# Patient Record
Sex: Female | Born: 1995 | Race: Black or African American | Hispanic: No | Marital: Single | Smoking: Never smoker
Health system: Southern US, Community
[De-identification: ages and names within clinical notes are randomized; demographics above are authoritative.]

## PROBLEM LIST (undated history)

## (undated) HISTORY — PX: KNEE SURGERY: SHX244

---

## 2018-08-10 ENCOUNTER — Ambulatory Visit (HOSPITAL_COMMUNITY)
Admission: EM | Admit: 2018-08-10 | Discharge: 2018-08-10 | Disposition: A | Discharge status: Home / Self Care | Payer: BLUE CROSS/BLUE SHIELD | Attending: Family Medicine | Admitting: Family Medicine

## 2018-08-10 ENCOUNTER — Encounter (HOSPITAL_COMMUNITY): Payer: Self-pay

## 2018-08-10 DIAGNOSIS — J309 Allergic rhinitis, unspecified: Secondary | ICD-10-CM | POA: Diagnosis not present

## 2018-08-10 MED ORDER — FLUTICASONE PROPIONATE 50 MCG/ACT NA SUSP: 1.0000 | Freq: Every day | NASAL | 2 refills | Status: AC

## 2018-08-10 MED ORDER — CETIRIZINE-PSEUDOEPHEDRINE ER 5-120 MG PO TB12: 1.0000 | ORAL_TABLET | Freq: Every day | ORAL | 0 refills | Status: AC

## 2018-08-10 NOTE — Discharge Instructions (Signed)
I am sending some Zyrtec-D and Flonase to the pharmacy for your  symptoms Follow up as needed for continued or worsening symptoms

## 2018-08-10 NOTE — ED Provider Notes (Signed)
[facility]    CSN: [id number] Arrival date & time: 08/10/18  1659     History   Chief Complaint Chief Complaint  Patient presents with  . URI    HPI Yvonne Walton is a 23 y.o. female.   Patient is a 23 year old female who presents with 2 days of sinus congestion, rhinorrhea, sneezing, dry cough, body aches, scratchy throat.  Symptoms have been constant.  She has been using Mucinex severe cold and cough that she got last night.  Reports that this did not help her symptoms.  Positive sick contacts. No recent traveling.  No fevers.   ROS per HPI      History reviewed. No pertinent past medical history.  There are no active problems to display for this patient.   Past Surgical History:  Procedure Laterality Date  . KNEE SURGERY      OB History   No obstetric history on file.      Home Medications    Prior to Admission medications   Medication Sig Start Date End Date Taking? Authorizing Provider  cetirizine-pseudoephedrine (ZYRTEC-D) 5-120 MG tablet Take 1 tablet by mouth daily. 08/10/18   Rosser Collington, Gloris Manchester A, NP  fluticasone (FLONASE) 50 MCG/ACT nasal spray Place 1 spray into both nostrils daily. 08/10/18   Janace Aris, NP    Family History Family History  Problem Relation Age of Onset  . Healthy Mother   . Healthy Father     Social History Social History   Tobacco Use  . Smoking status: Never Smoker  . Smokeless tobacco: Never Used  Substance Use Topics  . Alcohol use: Not on file  . Drug use: Not on file     Allergies   Patient has no known allergies.   Review of Systems Review of Systems   Physical Exam Triage Vital Signs ED Triage Vitals  Enc Vitals Group     BP 08/10/18 1747 (!) 144/91     Pulse Rate 08/10/18 1747 95     Resp 08/10/18 1747 17     Temp 08/10/18 1747 98.7 F (37.1 C)     Temp Source 08/10/18 1747 Oral     SpO2 08/10/18 1747 100 %     Weight --      Height --      Head Circumference --      Peak Flow --      Pain Score 08/10/18 1814 0     Pain Loc --      Pain Edu? --      Excl. in GC? --    No data found.  Updated Vital Signs BP (!) 144/91 (BP Location: Right Arm)   Pulse 95   Temp 98.7 F (37.1 C) (Oral)   Resp 17   LMP 07/24/2018   SpO2 100%   Visual Acuity Right Eye Distance:   Left Eye Distance:   Bilateral Distance:    Right Eye Near:   Left Eye Near:    Bilateral Near:     Physical Exam Vitals signs and nursing note reviewed.  Constitutional:      General: She is not in acute distress.    Appearance: She is well-developed. She is not ill-appearing, toxic-appearing or diaphoretic.  HENT:     Head: Normocephalic and atraumatic.     Right Ear: Tympanic membrane and ear canal normal.     Left Ear: Tympanic membrane and ear canal normal.     Nose: Congestion present.  Mouth/Throat:     Pharynx: Oropharynx is clear.  Eyes:     Conjunctiva/sclera: Conjunctivae normal.  Neck:     Musculoskeletal: Normal range of motion and neck supple.  Cardiovascular:     Rate and Rhythm: Normal rate and regular rhythm.     Heart sounds: No murmur.  Pulmonary:     Effort: Pulmonary effort is normal. No respiratory distress.     Breath sounds: Normal breath sounds.  Musculoskeletal: Normal range of motion.  Lymphadenopathy:     Cervical: No cervical adenopathy.  Skin:    General: Skin is warm and dry.  Neurological:     Mental Status: She is alert.  Psychiatric:        Mood and Affect: Mood normal.      UC Treatments / Results  Labs (all labs ordered are listed, but only abnormal results are displayed) Labs Reviewed - No data to display  EKG None  Radiology No results found.  Procedures Procedures (including critical care time)  Medications Ordered in UC Medications - No data to display  Initial Impression / Assessment and Plan / UC Course  I have reviewed the triage vital signs and the nursing notes.  Pertinent labs & imaging results that were available  during my care of the patient were reviewed by me and considered in my medical decision making (see chart for details).     Symptoms consistent with allergic rhinitis Flonase and Zyrtec-D for symptoms Follow up as needed for continued or worsening symptoms  Final Clinical Impressions(s) / UC Diagnoses   Final diagnoses:  Allergic rhinitis, unspecified seasonality, unspecified trigger     Discharge Instructions     I am sending some Zyrtec-D and Flonase to the pharmacy for your  symptoms Follow up as needed for continued or worsening symptoms     ED Prescriptions    Medication Sig Dispense Auth. Provider   cetirizine-pseudoephedrine (ZYRTEC-D) 5-120 MG tablet Take 1 tablet by mouth daily. 30 tablet Marlan Steward A, NP   fluticasone (FLONASE) 50 MCG/ACT nasal spray Place 1 spray into both nostrils daily. 16 g Dahlia ByesBast, Cathaleen Korol A, NP     Controlled Substance Prescriptions [place] Controlled Substance Registry consulted? Not Applicable   Janace ArisBast, Ranvir Renovato A, NP 08/10/18 1821

## 2018-08-10 NOTE — ED Triage Notes (Signed)
Pt presents with upper respiratory symptoms; congestion, nasal drainage, chest pain from cough, body aches and sore throat X 2 days.

## 2018-08-19 ENCOUNTER — Emergency Department (HOSPITAL_COMMUNITY): Payer: BLUE CROSS/BLUE SHIELD

## 2018-08-19 ENCOUNTER — Emergency Department (HOSPITAL_COMMUNITY)
Admission: EM | Admit: 2018-08-19 | Discharge: 2018-08-19 | Disposition: A | Discharge status: Home / Self Care | Payer: BLUE CROSS/BLUE SHIELD | Attending: Emergency Medicine | Admitting: Emergency Medicine

## 2018-08-19 ENCOUNTER — Encounter (HOSPITAL_COMMUNITY): Payer: Self-pay | Admitting: Emergency Medicine

## 2018-08-19 DIAGNOSIS — K5900 Constipation, unspecified: Secondary | ICD-10-CM | POA: Diagnosis present

## 2018-08-19 DIAGNOSIS — R1084 Generalized abdominal pain: Secondary | ICD-10-CM | POA: Diagnosis not present

## 2018-08-19 LAB — CBC WITH DIFFERENTIAL/PLATELET
Abs Immature Granulocytes: 0.04 10*3/uL (ref 0.00–0.07)
Basophils Absolute: 0 10*3/uL (ref 0.0–0.1)
Basophils Relative: 0 %
Eosinophils Absolute: 0.1 10*3/uL (ref 0.0–0.5)
Eosinophils Relative: 1 %
HCT: 41.2 % (ref 36.0–46.0)
Hemoglobin: 13 g/dL (ref 12.0–15.0)
Immature Granulocytes: 0 %
Lymphocytes Relative: 23 %
Lymphs Abs: 2.6 10*3/uL (ref 0.7–4.0)
MCH: 29.8 pg (ref 26.0–34.0)
MCHC: 31.6 g/dL (ref 30.0–36.0)
MCV: 94.5 fL (ref 80.0–100.0)
Monocytes Absolute: 0.7 10*3/uL (ref 0.1–1.0)
Monocytes Relative: 6 %
Neutro Abs: 7.7 10*3/uL (ref 1.7–7.7)
Neutrophils Relative %: 70 %
Platelets: 285 10*3/uL (ref 150–400)
RBC: 4.36 MIL/uL (ref 3.87–5.11)
RDW: 13 % (ref 11.5–15.5)
WBC: 11.1 10*3/uL — ABNORMAL HIGH (ref 4.0–10.5)
nRBC: 0 % (ref 0.0–0.2)

## 2018-08-19 LAB — COMPREHENSIVE METABOLIC PANEL
ALT: 17 U/L (ref 0–44)
AST: 17 U/L (ref 15–41)
Albumin: 5.1 g/dL — ABNORMAL HIGH (ref 3.5–5.0)
Alkaline Phosphatase: 82 U/L (ref 38–126)
Anion gap: 7 (ref 5–15)
BUN: 8 mg/dL (ref 6–20)
CO2: 28 mmol/L (ref 22–32)
Calcium: 9.6 mg/dL (ref 8.9–10.3)
Chloride: 103 mmol/L (ref 98–111)
Creatinine, Ser: 0.82 mg/dL (ref 0.44–1.00)
GFR calc Af Amer: >60 mL/min (ref >60)
GFR calc non Af Amer: >60 mL/min (ref >60)
Glucose, Bld: 110 mg/dL — ABNORMAL HIGH (ref 70–99)
Potassium: 3.6 mmol/L (ref 3.5–5.1)
Sodium: 138 mmol/L (ref 135–145)
Total Bilirubin: 0.4 mg/dL (ref 0.3–1.2)
Total Protein: 9.6 g/dL — ABNORMAL HIGH (ref 6.5–8.1)

## 2018-08-19 LAB — URINALYSIS, ROUTINE W REFLEX MICROSCOPIC
Bilirubin Urine: NEGATIVE
Glucose, UA: NEGATIVE mg/dL
Hgb urine dipstick: NEGATIVE
Ketones, ur: NEGATIVE mg/dL
Leukocytes,Ua: NEGATIVE
Nitrite: NEGATIVE
Protein, ur: NEGATIVE mg/dL
Specific Gravity, Urine: 1.011 (ref 1.005–1.030)
pH: 7 (ref 5.0–8.0)

## 2018-08-19 LAB — I-STAT BETA HCG BLOOD, ED (MC, WL, AP ONLY): I-stat hCG, quantitative: <5 m[IU]/mL (ref <5)

## 2018-08-19 LAB — LIPASE, BLOOD: Lipase: 33 U/L (ref 11–51)

## 2018-08-19 MED ORDER — POLYETHYLENE GLYCOL 3350 17 GM/SCOOP PO POWD: 1.0000 | Freq: Once | ORAL | 0 refills | Status: AC

## 2018-08-19 MED ORDER — ONDANSETRON 4 MG PO TBDP: 4.0000 mg | ORAL_TABLET | Freq: Three times a day (TID) | ORAL | 0 refills | Status: AC | PRN

## 2018-08-19 MED ORDER — DOCUSATE SODIUM 100 MG PO CAPS: 100.0000 mg | ORAL_CAPSULE | Freq: Two times a day (BID) | ORAL | 0 refills | Status: AC

## 2018-08-19 MED ORDER — ONDANSETRON HCL 4 MG/2ML IJ SOLN
4.0000 mg | Freq: Once | INTRAMUSCULAR | Status: AC
  Administered 2018-08-19: 4 mg via INTRAVENOUS
  Filled 2018-08-19: qty 2

## 2018-08-19 MED ORDER — SODIUM CHLORIDE 0.9 % IV BOLUS
1000.0000 mL | Freq: Once | INTRAVENOUS | Status: AC
  Administered 2018-08-19: 1000 mL via INTRAVENOUS

## 2018-08-19 MED ORDER — KETOROLAC TROMETHAMINE 30 MG/ML IJ SOLN
30.0000 mg | Freq: Once | INTRAMUSCULAR | Status: AC
  Administered 2018-08-19: 30 mg via INTRAVENOUS
  Filled 2018-08-19: qty 1

## 2018-08-19 NOTE — ED Provider Notes (Signed)
[facility]EMERGENCY [facility]PT Provider Note   CSN: [id number] Arrival date & time: 08/19/18  1008    History   Chief Complaint Chief Complaint  Patient presents with  . abd pain  . Constipation    HPI Yvonne Walton is a 23 y.o. female with no significant past medical history presents for acute onset, progressively worsening constipation for 2 weeks and abdominal pain for 1 week.  Reports very decreased stool output for the last 2 weeks, one episode of nonbloody watery diarrhea.  For the last week or so she has had a constant dull pain to the middle of the abdomen with occasional sharp cramping pain to the midline.  Pain sometimes radiates to the flanks bilaterally. Notes nausea but not vomiting. Notes feeling bloated and passing more flatus and belching more than usual. Denies associated urinary symptoms, fever, chills, chest pain, cough.  Has not tried anything for her symptoms.  Reports that her diet mostly consists of fast food, chicken.  Reports that she drinks 2-3 water bottles daily.     The history is provided by the patient.    History reviewed. No pertinent past medical history.  There are no active problems to display for this patient.   Past Surgical History:  Procedure Laterality Date  . KNEE SURGERY       OB History   No obstetric history on file.      Home Medications    Prior to Admission medications   Medication Sig Start Date End Date Taking? Authorizing Provider  BIOTIN PO Take 1 tablet by mouth 3 (three) times a week.   Yes [provider]  fluticasone (FLONASE) 50 MCG/ACT nasal spray Place 1 spray into both nostrils daily. Patient taking differently: Place 1 spray into both nostrils daily as needed for allergies or rhinitis.  08/10/18  Yes Walton, Yvonne A, NP  ibuprofen (ADVIL,MOTRIN) 200 MG tablet Take 800 mg by mouth every 6 (six) hours as needed for moderate pain.   Yes [provider]  cetirizine-pseudoephedrine  (ZYRTEC-D) 5-120 MG tablet Take 1 tablet by mouth daily. Patient not taking: Reported on 08/19/2018 08/10/18   Dahlia ByesBast, Yvonne A, NP  docusate sodium (COLACE) 100 MG capsule Take 1 capsule (100 mg total) by mouth every 12 (twelve) hours. 08/19/18   Luevenia MaxinFawze, Yvonne Lana A, PA-C  ondansetron (ZOFRAN ODT) 4 MG disintegrating tablet Take 1 tablet (4 mg total) by mouth every 8 (eight) hours as needed. 08/19/18   Yvonne Walton A, PA-C  polyethylene glycol powder (MIRALAX) powder Take 255 g by mouth once for 1 dose. 08/19/18 08/19/18  Jeanie SewerFawze, Yvonne Resch A, PA-C    Family History Family History  Problem Relation Age of Onset  . Healthy Mother   . Healthy Father     Social History Social History   Tobacco Use  . Smoking status: Never Smoker  . Smokeless tobacco: Never Used  Substance Use Topics  . Alcohol use: Yes  . Drug use: Not on file     Allergies   Patient has no known allergies.   Review of Systems Review of Systems  Constitutional: Negative for chills and fever.  Respiratory: Negative for shortness of breath.   Cardiovascular: Negative for chest pain.  Gastrointestinal: Positive for abdominal pain, constipation and nausea. Negative for blood in stool and vomiting.  Genitourinary: Negative for dysuria, flank pain, hematuria and urgency.  All other systems reviewed and are negative.    Physical Exam Updated Vital Signs BP (!) 113/94  Pulse 77   Temp 98.4 F (36.9 C) (Oral)   Resp 16   LMP 07/24/2018   SpO2 99%   Physical Exam Vitals signs and nursing note reviewed.  Constitutional:      General: She is not in acute distress.    Appearance: She is well-developed.     Comments: Resting comfortably in bed  HENT:     Head: Normocephalic and atraumatic.  Eyes:     General:        Right eye: No discharge.        Left eye: No discharge.     Conjunctiva/sclera: Conjunctivae normal.  Neck:     Vascular: No JVD.     Trachea: No tracheal deviation.  Cardiovascular:     Rate and Rhythm:  Normal rate.  Pulmonary:     Effort: Pulmonary effort is normal.  Abdominal:     General: Abdomen is protuberant. Bowel sounds are increased. There is no distension.     Palpations: Abdomen is soft.     Tenderness: There is generalized abdominal tenderness and tenderness in the right upper quadrant, right lower quadrant, epigastric area and periumbilical area. There is no right CVA tenderness, left CVA tenderness, guarding or rebound. Negative signs include Murphy's sign, Rovsing's sign, McBurney's sign and psoas sign.  Skin:    General: Skin is warm and dry.     Findings: No erythema.  Neurological:     Mental Status: She is alert.  Psychiatric:        Behavior: Behavior normal.      ED Treatments / Results  Labs (all labs ordered are listed, but only abnormal results are displayed) Labs Reviewed  CBC WITH DIFFERENTIAL/PLATELET - Abnormal; Notable for the following components:      Result Value   WBC 11.1 (*)    All other components within normal limits  COMPREHENSIVE METABOLIC PANEL - Abnormal; Notable for the following components:   Glucose, Bld 110 (*)    Total Protein 9.6 (*)    Albumin 5.1 (*)    All other components within normal limits  URINALYSIS, ROUTINE W REFLEX MICROSCOPIC - Abnormal; Notable for the following components:   Color, Urine STRAW (*)    All other components within normal limits  LIPASE, BLOOD  I-STAT BETA HCG BLOOD, ED ([facility], [facility], [facility] ONLY)    EKG None  Radiology Dg Abdomen Acute W/chest  Result Date: 08/19/2018 CLINICAL DATA:  Abdominal pain, constipation EXAM: DG ABDOMEN ACUTE W/ 1V CHEST COMPARISON:  None FINDINGS: Normal heart size, mediastinal contours, and pulmonary vascularity. Lungs clear. No infiltrate, pleural effusion or pneumothorax. Bowel gas pattern normal. No bowel dilatation bowel wall thickening, or free air. Osseous structures unremarkable. No urinary tract calcification. IMPRESSION: Normal exam. Electronically Signed   By: Ulyses Southward M.D.   On: 08/19/2018 15:19    Procedures Procedures (including critical care time)  Medications Ordered in ED Medications  ketorolac (TORADOL) 30 MG/ML injection 30 mg (has no administration in time range)  ondansetron (ZOFRAN) injection 4 mg (4 mg Intravenous Given 08/19/18 1353)  sodium chloride 0.9 % bolus 1,000 mL (0 mLs Intravenous Stopped 08/19/18 1620)     Initial Impression / Assessment and Plan / ED Course  I have reviewed the triage vital signs and the nursing notes.  Pertinent labs & imaging results that were available during my care of the patient were reviewed by me and considered in my medical decision making (see chart for details).  Patient complaining of intermittent generalized cramping abdominal pains with constipation.  Constipation is been present for the last 2 weeks, pain for the last week.  She is afebrile, vital signs are stable.  She is nontoxic in appearance.  No peritoneal signs on examination of the abdomen.  Lab work reviewed by myself is significant for mild nonspecific leukocytosis, no anemia, no metabolic derangements.  LFTs, lipase, creatinine within normal limits.  She is not pregnant.  Acute abdomen with chest radiographs obtained which show no evidence of ileus or obstruction.  No acute cardiopulmonary abnormalities.  She was given IV fluids, Toradol, and Zofran in the ED with improvement.  Serial abdominal examinations remained benign.  No evidence of acute surgical abdominal pathology including obstruction, perforation, appendicitis, cholecystitis, or dissection.  Doubt TOA, PID, ovarian torsion, or ectopic pregnancy in the absence of GU complaints.  Pain likely secondary to constipation.  Will discharge with bowel regimen, Zofran, encouraged increased water intake and activity.  Recommend follow-up with PCP for reevaluation of symptoms.  Discussed strict ED return precautions. Pt and family verbalized understanding of and agreement with plan  and patient is safe for discharge home at this time.   Final Clinical Impressions(s) / ED Diagnoses   Final diagnoses:  Constipation, unspecified constipation type  Generalized abdominal pain    ED Discharge Orders         Ordered    polyethylene glycol powder (MIRALAX) powder   Once     08/19/18 1623    docusate sodium (COLACE) 100 MG capsule  Every 12 hours     08/19/18 1623    ondansetron (ZOFRAN ODT) 4 MG disintegrating tablet  Every 8 hours PRN     08/19/18 1623           Uriah Trueba, Chemult A, PA-C 08/19/18 1626    Shaune Pollack, MD 08/20/18 1345

## 2018-08-19 NOTE — Discharge Instructions (Signed)
1. Medications: Take 7 capfuls of MiraLAX in 32 ounces of water today.  After that, you can take 1-2 capfuls a day in 8 ounces of water.  You can also take a stool softener (colace). You can alternate 600 mg of ibuprofen and [phone] mg of Tylenol every 3 hours as needed for pain. Do not exceed 4000 mg of Tylenol daily.  Take ibuprofen with food to avoid upset stomach.  Take Zofran as needed for nausea.  Let this medicine dissolve under your tongue and wait around 10-20 minutes before eating or drinking after taking this medication. 2. Treatment: rest, drink plenty of fluids, eat a high-fiber diet.  I have attached information on this. 3. Follow Up: Please followup with your primary doctor in 3 days for discussion of your diagnoses and further evaluation after today's visit; if you do not have a primary care doctor use the resource guide provided to find one; Please return to the ER for persistent vomiting, high fevers or worsening symptoms

## 2018-08-19 NOTE — ED Triage Notes (Signed)
Pt c/o abd pains with constipation for 2 weeks.

## 2018-10-17 ENCOUNTER — Encounter (HOSPITAL_COMMUNITY): Payer: Self-pay | Admitting: *Deleted

## 2018-10-17 ENCOUNTER — Emergency Department (HOSPITAL_COMMUNITY): Payer: BLUE CROSS/BLUE SHIELD

## 2018-10-17 ENCOUNTER — Emergency Department (HOSPITAL_COMMUNITY)
Admission: EM | Admit: 2018-10-17 | Discharge: 2018-10-18 | Disposition: A | Discharge status: Home / Self Care | Payer: BLUE CROSS/BLUE SHIELD | Attending: Emergency Medicine | Admitting: Emergency Medicine

## 2018-10-17 ENCOUNTER — Other Ambulatory Visit: Payer: Self-pay

## 2018-10-17 DIAGNOSIS — R0781 Pleurodynia: Secondary | ICD-10-CM | POA: Insufficient documentation

## 2018-10-17 DIAGNOSIS — R079 Chest pain, unspecified: Secondary | ICD-10-CM | POA: Diagnosis not present

## 2018-10-17 DIAGNOSIS — J069 Acute upper respiratory infection, unspecified: Secondary | ICD-10-CM | POA: Insufficient documentation

## 2018-10-17 DIAGNOSIS — R05 Cough: Secondary | ICD-10-CM | POA: Insufficient documentation

## 2018-10-17 DIAGNOSIS — R070 Pain in throat: Secondary | ICD-10-CM | POA: Insufficient documentation

## 2018-10-17 DIAGNOSIS — H9209 Otalgia, unspecified ear: Secondary | ICD-10-CM | POA: Diagnosis present

## 2018-10-17 DIAGNOSIS — B9789 Other viral agents as the cause of diseases classified elsewhere: Secondary | ICD-10-CM

## 2018-10-17 LAB — BASIC METABOLIC PANEL
Anion gap: 8 (ref 5–15)
BUN: 12 mg/dL (ref 6–20)
CO2: 23 mmol/L (ref 22–32)
Calcium: 9.4 mg/dL (ref 8.9–10.3)
Chloride: 104 mmol/L (ref 98–111)
Creatinine, Ser: 0.74 mg/dL (ref 0.44–1.00)
GFR calc Af Amer: >60 mL/min (ref >60)
GFR calc non Af Amer: >60 mL/min (ref >60)
Glucose, Bld: 90 mg/dL (ref 70–99)
Potassium: 3.6 mmol/L (ref 3.5–5.1)
Sodium: 135 mmol/L (ref 135–145)

## 2018-10-17 LAB — CBC WITH DIFFERENTIAL/PLATELET
Abs Immature Granulocytes: 0.03 10*3/uL (ref 0.00–0.07)
Basophils Absolute: 0 10*3/uL (ref 0.0–0.1)
Basophils Relative: 0 %
Eosinophils Absolute: 0.1 10*3/uL (ref 0.0–0.5)
Eosinophils Relative: 1 %
HCT: 37.1 % (ref 36.0–46.0)
Hemoglobin: 12.1 g/dL (ref 12.0–15.0)
Immature Granulocytes: 0 %
Lymphocytes Relative: 25 %
Lymphs Abs: 2.9 10*3/uL (ref 0.7–4.0)
MCH: 30.3 pg (ref 26.0–34.0)
MCHC: 32.6 g/dL (ref 30.0–36.0)
MCV: 92.8 fL (ref 80.0–100.0)
Monocytes Absolute: 1.1 10*3/uL — ABNORMAL HIGH (ref 0.1–1.0)
Monocytes Relative: 9 %
Neutro Abs: 7.5 10*3/uL (ref 1.7–7.7)
Neutrophils Relative %: 65 %
Platelets: 297 10*3/uL (ref 150–400)
RBC: 4 MIL/uL (ref 3.87–5.11)
RDW: 12.6 % (ref 11.5–15.5)
WBC: 11.6 10*3/uL — ABNORMAL HIGH (ref 4.0–10.5)
nRBC: 0 % (ref 0.0–0.2)

## 2018-10-17 LAB — GROUP A STREP BY PCR: Group A Strep by PCR: NOT DETECTED

## 2018-10-17 LAB — TROPONIN I: Troponin I: <0.03 ng/mL (ref <0.03)

## 2018-10-17 LAB — HCG, QUANTITATIVE, PREGNANCY: hCG, Beta Chain, Quant, S: <1 m[IU]/mL (ref <5)

## 2018-10-17 LAB — D-DIMER, QUANTITATIVE: D-Dimer, Quant: 0.83 ug/mL-FEU — ABNORMAL HIGH (ref 0.00–0.50)

## 2018-10-17 MED ORDER — IBUPROFEN 200 MG PO TABS
600.0000 mg | ORAL_TABLET | Freq: Once | ORAL | Status: AC
  Administered 2018-10-17: 20:00:00 600 mg via ORAL
  Filled 2018-10-17: qty 3

## 2018-10-17 MED ORDER — SODIUM CHLORIDE (PF) 0.9 % IJ SOLN
INTRAMUSCULAR | Status: AC
  Administered 2018-10-18: 1 mL
  Filled 2018-10-17: qty 50

## 2018-10-17 MED ORDER — IOHEXOL 350 MG/ML SOLN
100.0000 mL | Freq: Once | INTRAVENOUS | Status: AC | PRN
  Administered 2018-10-17: 100 mL via INTRAVENOUS

## 2018-10-17 NOTE — ED Provider Notes (Signed)
Care assumed from Dr. Criss Alvine at 11 PM.  Patient with URI symptoms including ear pain, throat pain and pleuritic chest pain.  She was anticipating CTA to rule out pulmonary embolism.  Anticipate discharge home with supportive care if this is negative.  CT PE is negative.  No evidence of pulmonary embolism or pneumonia or other acute pathology.  Patient no distress.  Plan discharge if symptomatic care for likely viral URI.  Discussed coronavirus cannot be completely ruled out and patient should quarantine herself at home per [facility] guidelines that were provided.  Return precautions discussed.  BP 116/70   Pulse 73   Temp 98.3 F (36.8 C) (Oral)   Resp (!) 21   SpO2 100%     Glynn Octave, MD 10/18/18 0430

## 2018-10-17 NOTE — ED Notes (Signed)
D dimer added. Spoke to [place] in the lab.

## 2018-10-17 NOTE — ED Notes (Signed)
Pt ambulated to the BR with steady gait.  Still waiting for CT angio

## 2018-10-17 NOTE — ED Triage Notes (Addendum)
Pt endorses L ear otalgia, L sore throat, and L rib pain x 2 days.  Denies any fever, cough, sob, or dizziness.  Inspiration makes the pain in her L rib worse.  Nothing alleviates the pain.  She appears comfortable and is A&Ox 4.  In NAD.

## 2018-10-17 NOTE — ED Notes (Signed)
Per Main lab, hcg quant to be added on to blood drawn at 2015hrs

## 2018-10-17 NOTE — ED Provider Notes (Signed)
[facility] Provider Note   CSN: [id number] Arrival date & time: 10/17/18  1934    History   Chief Complaint Chief Complaint  Patient presents with  . Otalgia  . Sore Throat  . Chest Pain    HPI Yvonne Walton is a 23 y.o. female.     HPI  23 year old female presents with sore throat, ear pain, and left-sided chest pain.  The chest pain started 2 days ago and is sharp and pleuritic to her left lower anterior chest.  She is also had sore throat since yesterday.  Started having some ear pain on the left today.  The sore throat is also on the left.  Feels similar to when she was diagnosed with otitis media and strep pharyngitis a few months ago.  She is never had the chest pain before.  She tried some Tylenol without relief.  No shortness of breath, leg pain or swelling, birth control use, or recent travel/surgery. No known COVID-19 contacts.  History reviewed. No pertinent past medical history.  There are no active problems to display for this patient.   Past Surgical History:  Procedure Laterality Date  . KNEE SURGERY       OB History   No obstetric history on file.      Home Medications    Prior to Admission medications   Medication Sig Start Date End Date Taking? Authorizing Provider  BIOTIN PO Take 1 tablet by mouth 3 (three) times a week.    [provider]  cetirizine-pseudoephedrine (ZYRTEC-D) 5-120 MG tablet Take 1 tablet by mouth daily. Patient not taking: Reported on 08/19/2018 08/10/18   Dahlia ByesBast, Traci A, NP  docusate sodium (COLACE) 100 MG capsule Take 1 capsule (100 mg total) by mouth every 12 (twelve) hours. 08/19/18   Fawze, Mina A, PA-C  fluticasone (FLONASE) 50 MCG/ACT nasal spray Place 1 spray into both nostrils daily. Patient taking differently: Place 1 spray into both nostrils daily as needed for allergies or rhinitis.  08/10/18   Dahlia ByesBast, Traci A, NP  ibuprofen (ADVIL,MOTRIN) 200 MG tablet Take 800 mg by mouth every  6 (six) hours as needed for moderate pain.    [provider]  ondansetron (ZOFRAN ODT) 4 MG disintegrating tablet Take 1 tablet (4 mg total) by mouth every 8 (eight) hours as needed. 08/19/18   Jeanie SewerFawze, Mina A, PA-C    Family History Family History  Problem Relation Age of Onset  . Healthy Mother   . Healthy Father     Social History Social History   Tobacco Use  . Smoking status: Never Smoker  . Smokeless tobacco: Never Used  Substance Use Topics  . Alcohol use: Yes  . Drug use: Not on file     Allergies   Patient has no known allergies.   Review of Systems Review of Systems  Constitutional: Negative for fever.  HENT: Positive for ear pain and sore throat. Negative for congestion and rhinorrhea.   Respiratory: Negative for cough and shortness of breath.   Cardiovascular: Positive for chest pain.  Gastrointestinal: Negative for vomiting.  All other systems reviewed and are negative.    Physical Exam Updated Vital Signs BP (!) 139/95 (BP Location: Left Arm)   Pulse 94   Temp 98.3 F (36.8 C) (Oral)   Resp (!) 21   SpO2 100%   Physical Exam Vitals signs and nursing note reviewed.  Constitutional:      Appearance: She is well-developed.  HENT:  Head: Normocephalic and atraumatic.     Right Ear: Tympanic membrane, ear canal and external ear normal.     Left Ear: Tympanic membrane, ear canal and external ear normal.     Nose: Nose normal.     Mouth/Throat:     Pharynx: Posterior oropharyngeal erythema (mild) present.     Tonsils: No tonsillar exudate or tonsillar abscesses.  Eyes:     General:        Right eye: No discharge.        Left eye: No discharge.  Cardiovascular:     Rate and Rhythm: Regular rhythm. Tachycardia present.     Heart sounds: Normal heart sounds.     Comments: HR 100 Pulmonary:     Effort: Pulmonary effort is normal.     Breath sounds: Normal breath sounds.  Chest:     Chest wall: No tenderness.  Abdominal:      Palpations: Abdomen is soft.     Tenderness: There is no abdominal tenderness.  Lymphadenopathy:     Cervical: No cervical adenopathy.  Skin:    General: Skin is warm and dry.  Neurological:     Mental Status: She is alert.  Psychiatric:        Mood and Affect: Mood is not anxious.      ED Treatments / Results  Labs (all labs ordered are listed, but only abnormal results are displayed) Labs Reviewed  CBC WITH DIFFERENTIAL/PLATELET - Abnormal; Notable for the following components:      Result Value   WBC 11.6 (*)    Monocytes Absolute 1.1 (*)    All other components within normal limits  D-DIMER, QUANTITATIVE (NOT AT [facility]) - Abnormal; Notable for the following components:   D-Dimer, Quant 0.83 (*)    All other components within normal limits  GROUP A STREP BY PCR  BASIC METABOLIC PANEL  TROPONIN I  HCG, QUANTITATIVE, PREGNANCY  I-STAT BETA HCG BLOOD, ED ([facility], [facility], [facility] ONLY)    EKG EKG Interpretation  Date/Time:  Friday October 17 2018 20:07:54 EDT Ventricular Rate:  91 PR Interval:    QRS Duration: 86 QT Interval:  369 QTC Calculation: 454 R Axis:   20 Text Interpretation:  Normal sinus rhythm no acute ST/T changes No old tracing to compare Confirmed by Pricilla Loveless [phone]) on 10/17/2018 8:18:50 PM   Radiology Dg Chest Portable 1 View  Result Date: 10/17/2018 CLINICAL DATA:  Left rib pain for 2 days. EXAM: PORTABLE CHEST 1 VIEW COMPARISON:  08/19/2018 FINDINGS: 1945 hours. The lungs are clear without focal pneumonia, edema, pneumothorax or pleural effusion. The cardiopericardial silhouette is within normal limits for size. The visualized bony structures of the thorax are intact. IMPRESSION: No active disease. Electronically Signed   By: Kennith Center M.D.   On: 10/17/2018 20:53    Procedures Procedures (including critical care time)  Medications Ordered in ED Medications  iohexol (OMNIPAQUE) 350 MG/ML injection 100 mL (has no administration in time range)   ibuprofen (ADVIL) tablet 600 mg (600 mg Oral Given 10/17/18 2017)     Initial Impression / Assessment and Plan / ED Course  I have reviewed the triage vital signs and the nursing notes.  Pertinent labs & imaging results that were available during my care of the patient were reviewed by me and considered in my medical decision making (see chart for details).        Patient appears well.  She will be worked up for PE though is otherwise  low risk, save for the tachycardia on arrival.  Given the elevated d-dimer, CT angiography pending.  As for her sore throat and ear pain, the strep test is negative.  Highly doubt deep space neck infection or tonsillar abscess given benign exam.  There is no obvious otitis externa, mastoiditis, or otitis media on exam.  Probable URI, will treat symptomatically with Tylenol and ibuprofen. Care to Dr. Manus Gunning with CT pending.  Layia Gama was evaluated in Emergency Department on 10/17/2018 for the symptoms described in the history of present illness. She was evaluated in the context of the global COVID-19 pandemic, which necessitated consideration that the patient might be at risk for infection with the SARS-CoV-2 virus that causes COVID-19. Institutional protocols and algorithms that pertain to the evaluation of patients at risk for COVID-19 are in a state of rapid change based on information released by regulatory bodies including the CDC and federal and state organizations. These policies and algorithms were followed during the patient's care in the ED.   Final Clinical Impressions(s) / ED Diagnoses   Final diagnoses:  None    ED Discharge Orders    None       Pricilla Loveless, MD 10/17/18 2320

## 2018-10-18 MED ORDER — IBUPROFEN 600 MG PO TABS: 600.0000 mg | ORAL_TABLET | Freq: Four times a day (QID) | ORAL | 0 refills | Status: AC | PRN

## 2018-10-18 NOTE — Discharge Instructions (Signed)
Your testing is negative for pneumonia or blood clot in the lung.  As we discussed you likely have a viral infection.  Antibiotics would not be beneficial.  Keep yourself hydrated.  Use Tylenol or ibuprofen as needed for aches and fevers. Coronavirus is a possibility and you should isolate yourself at home per the guidelines provided. Follow-up with your doctor.  Return to the ED with difficulty breathing, worsening chest pain or any other concerns.     Person Under Monitoring Name: Yvonne Walton  Location: [address] [place] [address]   Infection Prevention Recommendations for Individuals Confirmed to have, or Being Evaluated for, 2019 Novel Coronavirus (COVID-19) Infection Who Receive Care at Home  Individuals who are confirmed to have, or are being evaluated for, COVID-19 should follow the prevention steps below until a healthcare provider or local or state health department says they can return to normal activities.  Stay home except to get medical care You should restrict activities outside your home, except for getting medical care. Do not go to work, school, or public areas, and do not use public transportation or taxis.  Call ahead before visiting your doctor Before your medical appointment, call the healthcare provider and tell them that you have, or are being evaluated for, COVID-19 infection. This will help the healthcare providers office take steps to keep other people from getting infected. Ask your healthcare provider to call the local or state health department.  Monitor your symptoms Seek prompt medical attention if your illness is worsening (e.g., difficulty breathing). Before going to your medical appointment, call the healthcare provider and tell them that you have, or are being evaluated for, COVID-19 infection. Ask your healthcare provider to call the local or state health department.  Wear a facemask You should wear a facemask that covers your nose  and mouth when you are in the same room with other people and when you visit a healthcare provider. People who live with or visit you should also wear a facemask while they are in the same room with you.  Separate yourself from other people in your home As much as possible, you should stay in a different room from other people in your home. Also, you should use a separate bathroom, if available.  Avoid sharing household items You should not share dishes, drinking glasses, cups, eating utensils, towels, bedding, or other items with other people in your home. After using these items, you should wash them thoroughly with soap and water.  Cover your coughs and sneezes Cover your mouth and nose with a tissue when you cough or sneeze, or you can cough or sneeze into your sleeve. Throw used tissues in a lined trash can, and immediately wash your hands with soap and water for at least 20 seconds or use an alcohol-based hand rub.  Wash your [employer] your hands often and thoroughly with soap and water for at least 20 seconds. You can use an alcohol-based hand sanitizer if soap and water are not available and if your hands are not visibly dirty. Avoid touching your eyes, nose, and mouth with unwashed hands.   Prevention Steps for Caregivers and Household Members of Individuals Confirmed to have, or Being Evaluated for, COVID-19 Infection Being Cared for in the Home  If you live with, or provide care at home for, a person confirmed to have, or being evaluated for, COVID-19 infection please follow these guidelines to prevent infection:  Follow healthcare providers instructions Make sure that you understand and  can help the patient follow any healthcare provider instructions for all care.  Provide for the patients basic needs You should help the patient with basic needs in the home and provide support for getting groceries, prescriptions, and other personal needs.  Monitor the patients  symptoms If they are getting sicker, call his or her medical provider and tell them that the patient has, or is being evaluated for, COVID-19 infection. This will help the healthcare providers office take steps to keep other people from getting infected. Ask the healthcare provider to call the local or state health department.  Limit the number of people who have contact with the patient If possible, have only one caregiver for the patient. Other household members should stay in another home or place of residence. If this is not possible, they should stay in another room, or be separated from the patient as much as possible. Use a separate bathroom, if available. Restrict visitors who do not have an essential need to be in the home.  Keep older adults, very young children, and other sick people away from the patient Keep older adults, very young children, and those who have compromised immune systems or chronic health conditions away from the patient. This includes people with chronic heart, lung, or kidney conditions, diabetes, and cancer.  Ensure good ventilation Make sure that shared spaces in the home have good air flow, such as from an air conditioner or an opened window, weather permitting.  Wash your hands often Wash your hands often and thoroughly with soap and water for at least 20 seconds. You can use an alcohol based hand sanitizer if soap and water are not available and if your hands are not visibly dirty. Avoid touching your eyes, nose, and mouth with unwashed hands. Use disposable paper towels to dry your hands. If not available, use dedicated cloth towels and replace them when they become wet.  Wear a facemask and gloves Wear a disposable facemask at all times in the room and gloves when you touch or have contact with the patients blood, body fluids, and/or secretions or excretions, such as sweat, saliva, sputum, nasal mucus, vomit, urine, or feces.  Ensure the mask fits over  your nose and mouth tightly, and do not touch it during use. Throw out disposable facemasks and gloves after using them. Do not reuse. Wash your hands immediately after removing your facemask and gloves. If your personal clothing becomes contaminated, carefully remove clothing and launder. Wash your hands after handling contaminated clothing. Place all used disposable facemasks, gloves, and other waste in a lined container before disposing them with other household waste. Remove gloves and wash your hands immediately after handling these items.  Do not share dishes, glasses, or other household items with the patient Avoid sharing household items. You should not share dishes, drinking glasses, cups, eating utensils, towels, bedding, or other items with a patient who is confirmed to have, or being evaluated for, COVID-19 infection. After the person uses these items, you should wash them thoroughly with soap and water.  Wash laundry thoroughly Immediately remove and wash clothes or bedding that have blood, body fluids, and/or secretions or excretions, such as sweat, saliva, sputum, nasal mucus, vomit, urine, or feces, on them. Wear gloves when handling laundry from the patient. Read and follow directions on labels of laundry or clothing items and detergent. In general, wash and dry with the warmest temperatures recommended on the label.  Clean all areas the individual has used often Clean all  touchable surfaces, such as counters, tabletops, doorknobs, bathroom fixtures, toilets, phones, keyboards, tablets, and bedside tables, every day. Also, clean any surfaces that may have blood, body fluids, and/or secretions or excretions on them. Wear gloves when cleaning surfaces the patient has come in contact with. Use a diluted bleach solution (e.g., dilute bleach with 1 part bleach and 10 parts water) or a household disinfectant with a label that says EPA-registered for coronaviruses. To make a bleach  solution at home, add 1 tablespoon of bleach to 1 quart (4 cups) of water. For a larger supply, add  cup of bleach to 1 gallon (16 cups) of water. Read labels of cleaning products and follow recommendations provided on product labels. Labels contain instructions for safe and effective use of the cleaning product including precautions you should take when applying the product, such as wearing gloves or eye protection and making sure you have good ventilation during use of the product. Remove gloves and wash hands immediately after cleaning.  Monitor yourself for signs and symptoms of illness Caregivers and household members are considered close contacts, should monitor their health, and will be asked to limit movement outside of the home to the extent possible. Follow the monitoring steps for close contacts listed on the symptom monitoring form.   ? If you have additional questions, contact your local health department or call the epidemiologist on call at [phone] (available 24/7). ? This guidance is subject to change. For the most up-to-date guidance from [facility], please refer to their website: [employer]Met[employer].huhttps://www.cdc.gov/coronavirus/2019-ncov/hcp/guidance-prevent-spread.html

## 2019-09-29 IMAGING — CT CT ANGIOGRAPHY CHEST
2 of 6 series · 18 of 46 positions shown · IV contrast (ISOVUE)
Comparison: 10/17/2018 chest radiograph.

CLINICAL DATA: 23 y/o F; dyspnea, sore throat, elevated D-dimer. PE
suspected, intermediate prob, positive D-dimer.

EXAM:
CT ANGIOGRAPHY CHEST WITH CONTRAST
TECHNIQUE: Multidetector CT imaging of the chest was performed using the
standard protocol during bolus administration of intravenous
contrast. Multiplanar CT image reconstructions and MIPs were
obtained to evaluate the vascular anatomy.
CONTRAST:  100mL OMNIPAQUE IOHEXOL 350 MG/ML SOLN

[Series 5: thins · axial · 0.62mm/px · z∈[+1502,+1703]mm · 15 of 221 slices shown]
[im 10/221  lung]
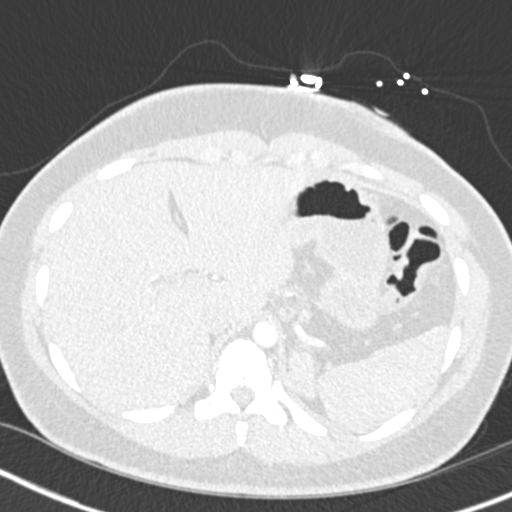
[im 29/221  soft-tissue]
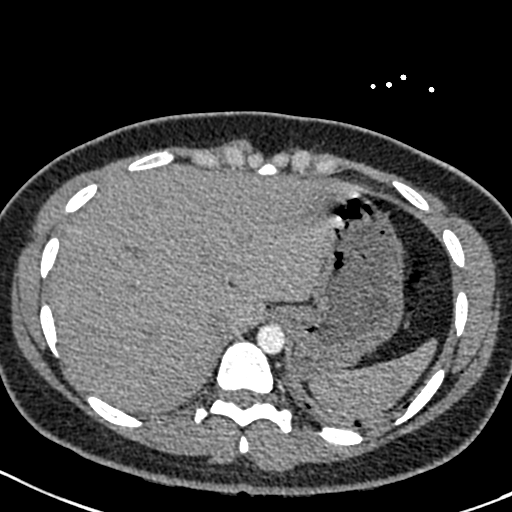
[im 39/221  lung]
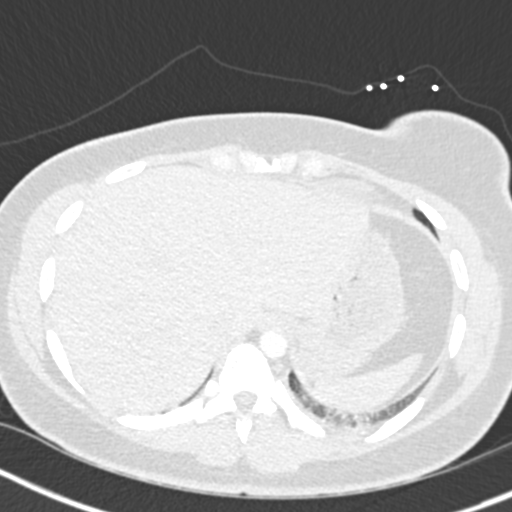
[im 58/221  soft-tissue]
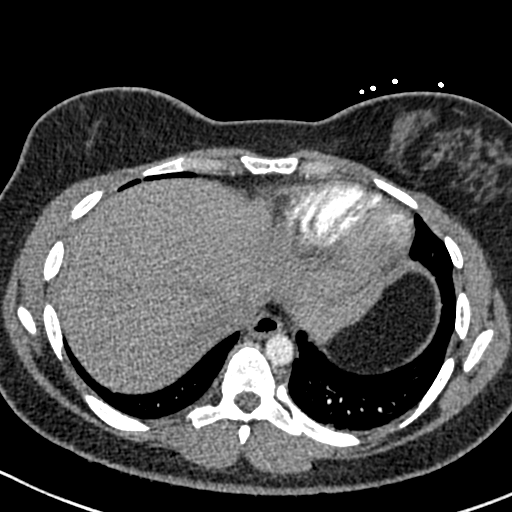
[im 67/221  lung]
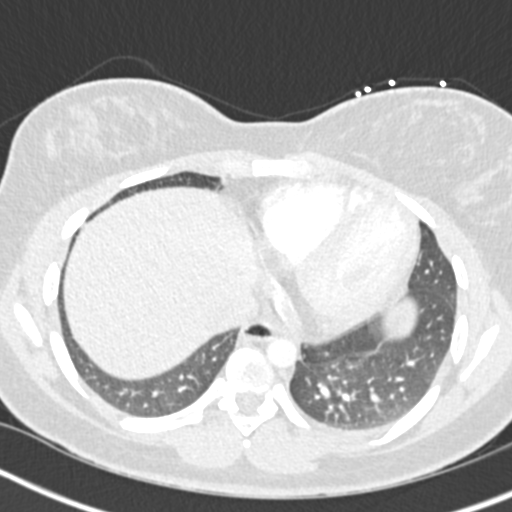
[im 87/221  soft-tissue]
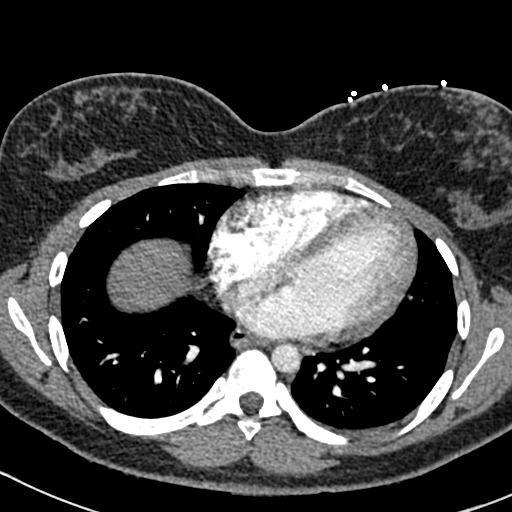
[im 96/221  lung]
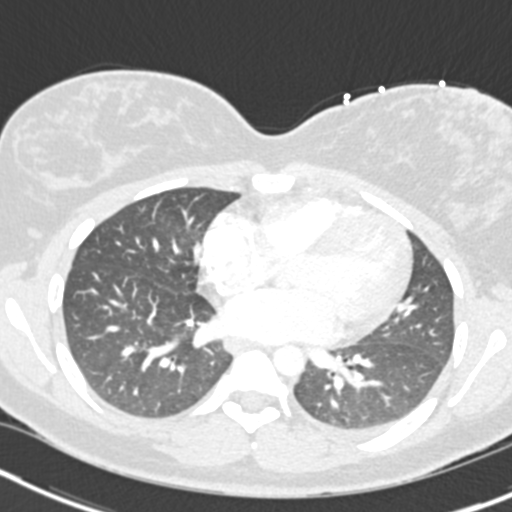
[im 115/221  soft-tissue]
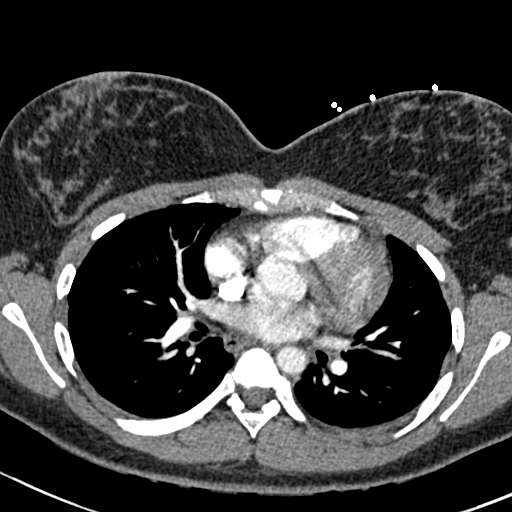
[im 125/221  lung]
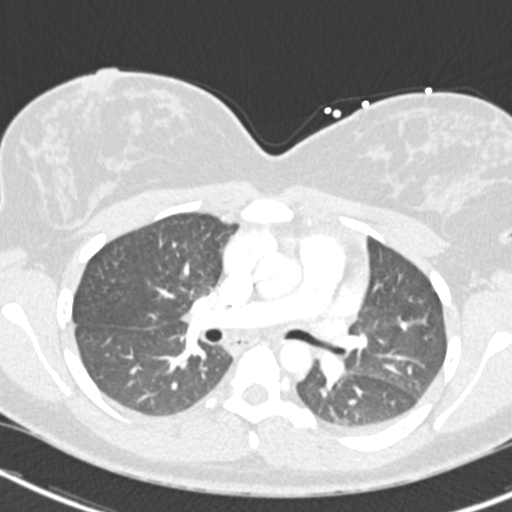
[im 134/221  soft-tissue]
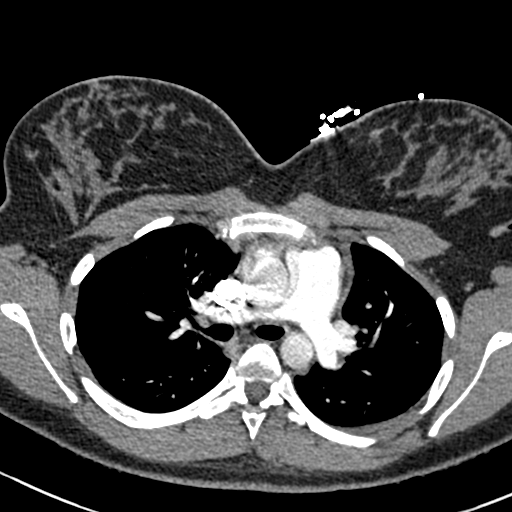
[im 154/221  lung]
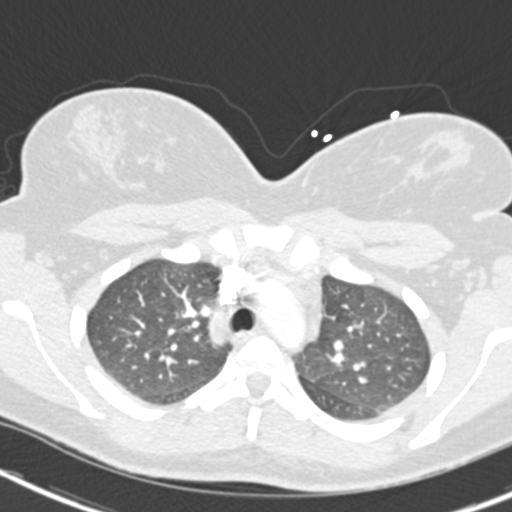
[im 163/221  soft-tissue]
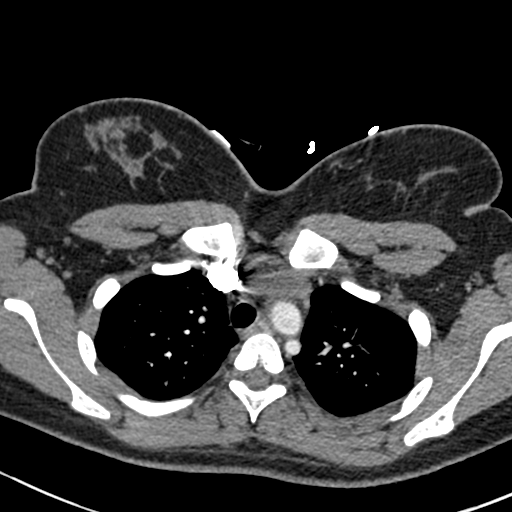
[im 182/221  lung]
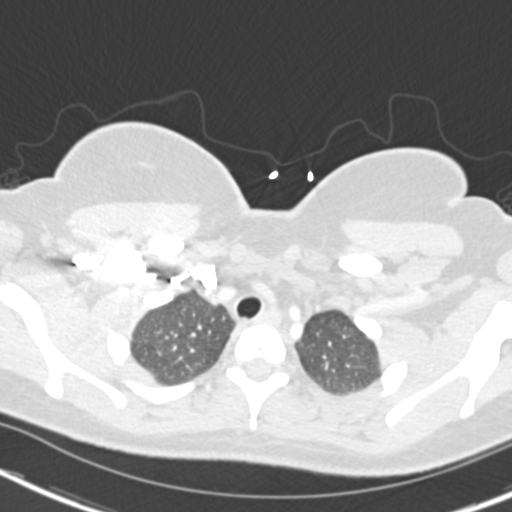
[im 192/221  soft-tissue]
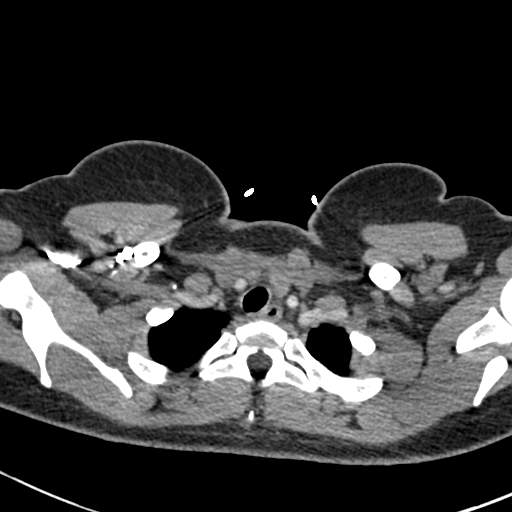
[im 211/221  lung]
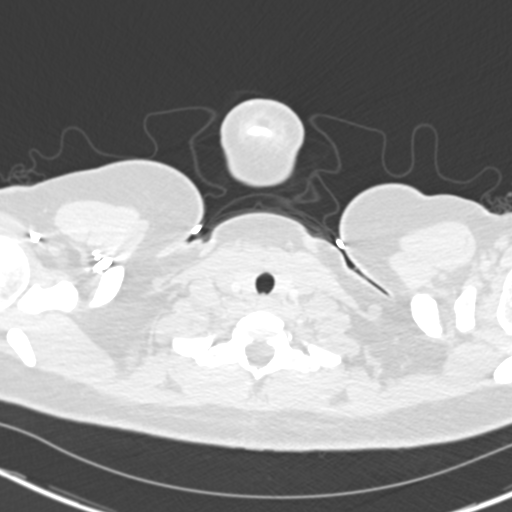

[Series 6: coronal mpr · coronal · 0.48mm/px · 3 of 149 slices shown]
[im 38/149  soft-tissue]
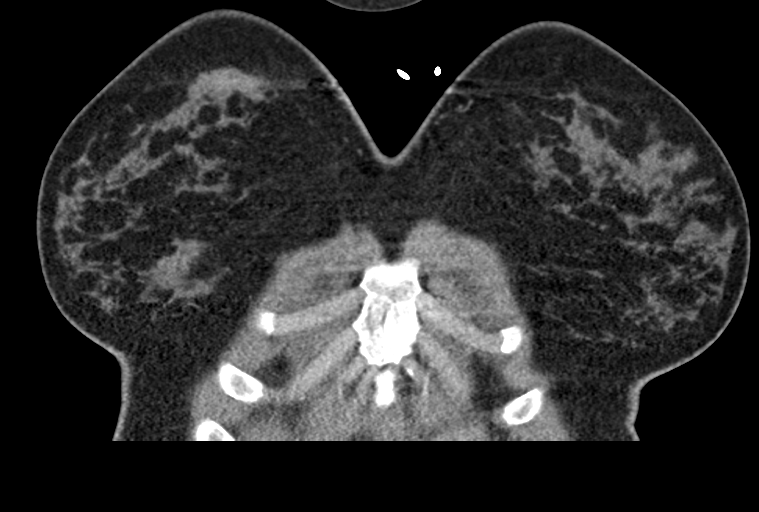
[im 75/149  soft-tissue]
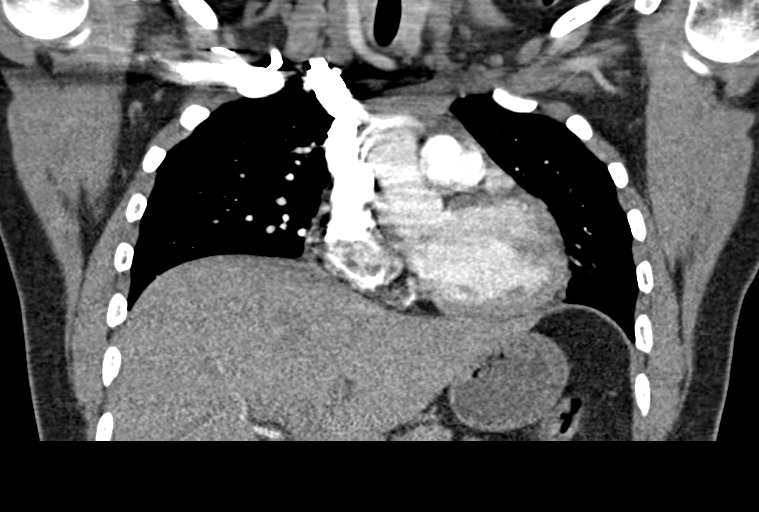
[im 112/149  soft-tissue]
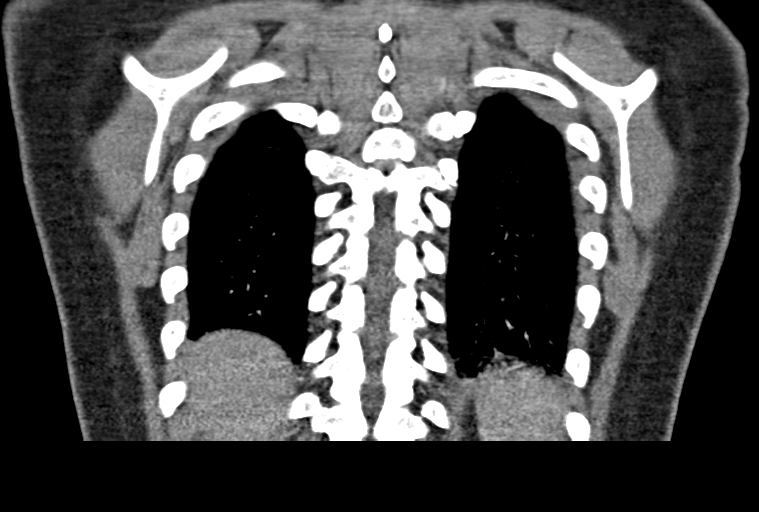

[18 of 46 positions shown; findings below may reference images not displayed]

FINDINGS: Cardiovascular: Satisfactory opacification of the pulmonary arteries
to the segmental level. No evidence of pulmonary embolism. Normal
heart size. No pericardial effusion.

Mediastinum/Nodes: No enlarged mediastinal, hilar, or axillary lymph
nodes. Thyroid gland, trachea, and esophagus demonstrate no
significant findings.

Lungs/Pleura: Lungs are clear. No pleural effusion or pneumothorax.

Upper Abdomen: No acute abnormality.

Musculoskeletal: No chest wall abnormality. No acute or significant
osseous findings.

Review of the MIP images confirms the above findings.
IMPRESSION: 1. No pulmonary embolus identified.
2. Unremarkable CTA of the chest.

## 2019-09-29 IMAGING — DX PORTABLE CHEST - 1 VIEW
1 series · 1 of 1 positions shown · non-contrast
Comparison: 08/19/2018

CLINICAL DATA: Left rib pain for 2 days.

EXAM:
PORTABLE CHEST 1 VIEW

[chest ap]
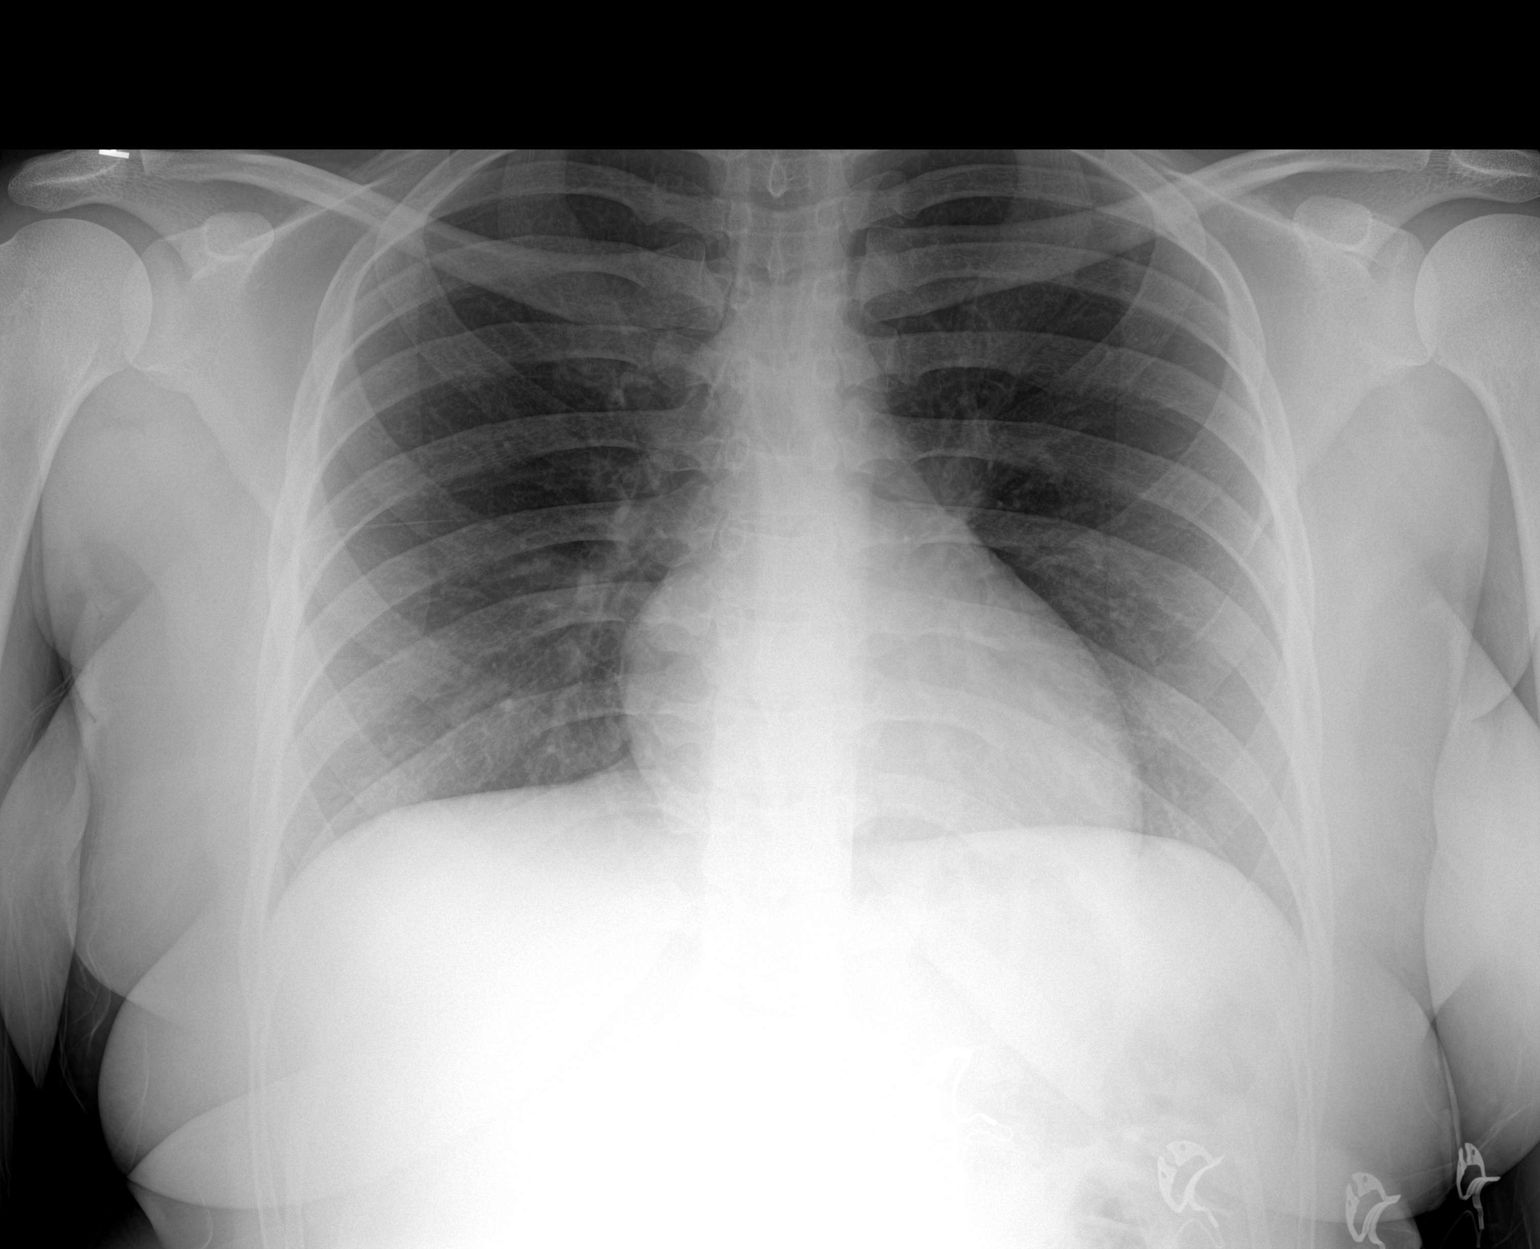

[1 of 1 positions shown; findings below may reference images not displayed]

FINDINGS: 4521 hours. The lungs are clear without focal pneumonia, edema,
pneumothorax or pleural effusion. The cardiopericardial silhouette
is within normal limits for size. The visualized bony structures of
the thorax are intact.
IMPRESSION: No active disease.
